# Patient Record
Sex: Female | Born: 2000 | Hispanic: No | Marital: Single
Health system: Southern US, Community
[De-identification: ages and names within clinical notes are randomized; demographics above are authoritative.]

## PROBLEM LIST (undated history)

## (undated) DIAGNOSIS — S92901A Unspecified fracture of right foot, initial encounter for closed fracture: Secondary | ICD-10-CM

## (undated) HISTORY — DX: Unspecified fracture of right foot, initial encounter for closed fracture: S92.901A

---
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
## 2023-05-07 IMAGING — CR DG ABDOMEN 1V
1 series · 1 of 1 positions shown · non-contrast
Comparison: 08/30/2015

CLINICAL DATA: Lower abdominal pain for 2-3 days.

EXAM:
ABDOMEN - 1 VIEW

[dg abd 1 view]
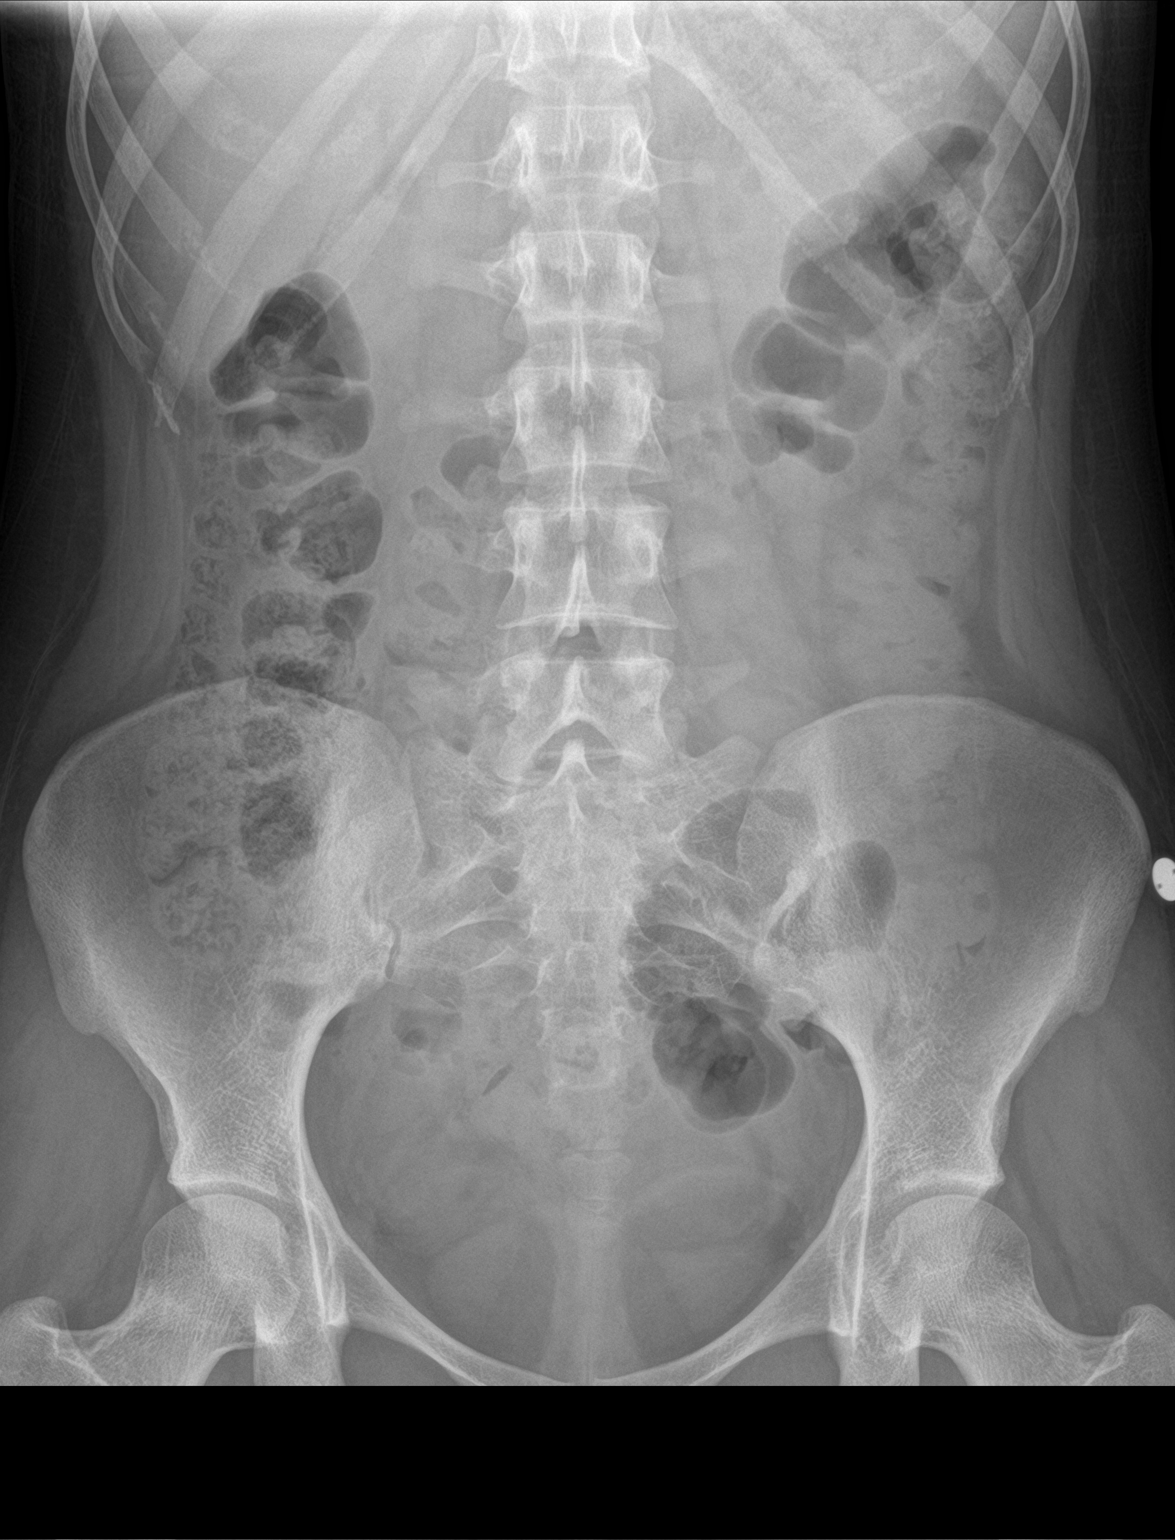

[1 of 1 positions shown; findings below may reference images not displayed]

FINDINGS: Moderate stool burden. No distended small bowel loops to suggest
obstruction. The soft tissue shadows are maintained. No worrisome
calcifications. The bony structures are intact.
IMPRESSION: Moderate stool burden.
